# Patient Record
Sex: Male | Born: 1995 | Race: White | Hispanic: No | Marital: Single | Smoking: Never smoker
Health system: Southern US, Community
[De-identification: ages and names within clinical notes are randomized; demographics above are authoritative.]

## PROBLEM LIST (undated history)

## (undated) DIAGNOSIS — F32A Depression, unspecified: Secondary | ICD-10-CM

## (undated) DIAGNOSIS — T7840XA Allergy, unspecified, initial encounter: Secondary | ICD-10-CM

## (undated) DIAGNOSIS — K219 Gastro-esophageal reflux disease without esophagitis: Secondary | ICD-10-CM

## (undated) DIAGNOSIS — F411 Generalized anxiety disorder: Secondary | ICD-10-CM

## (undated) HISTORY — PX: NO PAST SURGERIES: SHX2092

## (undated) HISTORY — DX: Generalized anxiety disorder: F41.1

## (undated) HISTORY — DX: Depression, unspecified: F32.A

## (undated) HISTORY — DX: Allergy, unspecified, initial encounter: T78.40XA

## (undated) HISTORY — DX: Gastro-esophageal reflux disease without esophagitis: K21.9

---
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
## 2017-06-29 ENCOUNTER — Encounter: Payer: Managed Care, Other (non HMO) | Admitting: Family Medicine

## 2017-06-29 NOTE — Progress Notes (Deleted)
Office Note 06/29/2017  CC: No chief complaint on file.   HPI:  Jason PimpleMatthew E Tersigni is a 21 y.o. male who is here for annual health maintenance exam.   Past Medical History:  Diagnosis Date  . GAD (generalized anxiety disorder)    sees psychiatrist as of 05/2016    No past surgical history on file.  Family History  Problem Relation Age of Onset  . Alcohol abuse Mother   . Diabetes Paternal Grandmother   . Heart disease Paternal Grandfather     Social History   Social History  . Marital status: Single    Spouse name: N/A  . Number of children: N/A  . Years of education: N/A   Occupational History  . Not on file.   Social History Main Topics  . Smoking status: Never Smoker  . Smokeless tobacco: Never Used  . Alcohol use No  . Drug use: No  . Sexual activity: Not on file   Other Topics Concern  . Not on file   Social History Narrative   [employer] grad.     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   No T/A/Ds.    Outpatient Medications Prior to Visit  Medication Sig Dispense Refill  . sertraline (ZOLOFT) 50 MG tablet Take 1 tablet by mouth daily.     No facility-administered medications prior to visit.     No Known Allergies  ROS *** PE; There were no vitals taken for this visit. *** Pertinent labs:  No results found for: TSH No results found for: WBC, HGB, HCT, MCV, PLT No results found for: CREATININE, BUN, NA, K, CL, CO2 No results found for: ALT, AST, GGT, ALKPHOS, BILITOT No results found for: CHOL No results found for: HDL No results found for: LDLCALC No results found for: TRIG No results found for: CHOLHDL No results found for: PSA    ASSESSMENT AND PLAN:   No problem-specific Assessment & Plan notes found for this encounter.   FOLLOW UP:  No Follow-up on file.

## 2018-11-10 ENCOUNTER — Encounter: Payer: Self-pay | Admitting: Emergency Medicine

## 2018-11-10 DIAGNOSIS — F411 Generalized anxiety disorder: Secondary | ICD-10-CM | POA: Insufficient documentation

## 2018-11-10 DIAGNOSIS — F1210 Cannabis abuse, uncomplicated: Secondary | ICD-10-CM | POA: Insufficient documentation

## 2018-11-17 ENCOUNTER — Ambulatory Visit: Payer: Self-pay | Admitting: Psychiatry

## 2018-12-15 DIAGNOSIS — Z23 Encounter for immunization: Secondary | ICD-10-CM | POA: Diagnosis not present

## 2019-04-01 ENCOUNTER — Encounter: Payer: Self-pay | Admitting: Family Medicine

## 2019-04-01 ENCOUNTER — Other Ambulatory Visit: Payer: Self-pay

## 2019-04-01 ENCOUNTER — Ambulatory Visit (INDEPENDENT_AMBULATORY_CARE_PROVIDER_SITE_OTHER): Payer: 59 | Admitting: Family Medicine

## 2019-04-01 DIAGNOSIS — F320 Major depressive disorder, single episode, mild: Secondary | ICD-10-CM | POA: Diagnosis not present

## 2019-04-01 DIAGNOSIS — F411 Generalized anxiety disorder: Secondary | ICD-10-CM

## 2019-04-01 MED ORDER — SERTRALINE HCL 50 MG PO TABS: ORAL_TABLET | ORAL | 0 refills | Status: DC

## 2019-04-01 NOTE — Progress Notes (Signed)
Virtual Visit via Video Note  I connected with pt on 04/01/19 at 11:00 AM EDT by a video enabled telemedicine application and verified that I am speaking with the correct person using two identifiers.  Location patient: home Location provider:work or home office Persons participating in the virtual visit: patient, provider  I discussed the limitations of evaluation and management by telemedicine and the availability of in person appointments. The patient expressed understanding and agreed to proceed.   HPI: 23 y/o male being seen today for f/u GAD. He has seen a psychiatrist for this problem in the past, on sertraline 100mg  qd when I saw him last in 2017 for [facility]. He tried to ween himself off sertraline about 8 mo ago, got off of it completely approx 10/2018. He wanted to see how he could do off of it.  He has come to realize he needs to get back on the med so he is interested in getting restarted on sertraline. He finds that he has been feeling a bit of a low mood consistently, poor motivation, worried/stressing a lot over everything, hard on himself.  No uncontrolled impulsivity, no SI or HI. No panic attacks, just generalized anxiety.   Trying to exercise more. This has all impacted his school work, grades have suffered significantly. Has been more distant with family, friends. +Anhedonia.  Trying increased caffeine. Minimal use of alcohol to try to boost mood or decrease anxiety. No illegal drugs.   ROS: See pertinent positives and negatives per HPI.  Past Medical History:  Diagnosis Date  . GAD (generalized anxiety disorder)    sees psychiatrist as of 05/2016    No past surgical history on file.  Family History  Problem Relation Age of Onset  . Alcohol abuse Mother   . Diabetes Paternal Grandmother   . 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   Current Outpatient Medications:  .  sertraline  (ZOLOFT) 100 MG tablet, Take 100 mg by mouth daily., Disp: , Rfl:  NOT CURRENTLY TAKING THIS MED  EXAM:  VITALS per patient if applicable: There were no vitals taken for this visit.   GENERAL: alert, oriented, appears well and in no acute distress  HEENT: atraumatic, conjunttiva clear, no obvious abnormalities on inspection of external nose and ears  NECK: normal movements of the head and neck  LUNGS: on inspection no signs of respiratory distress, breathing rate appears normal, no obvious gross SOB, gasping or wheezing  CV: no obvious cyanosis  MS: moves all visible extremities without noticeable abnormality  PSYCH/NEURO: pleasant and cooperative, no obvious depression or anxiety, speech and thought processing grossly intact  ASSESSMENT AND PLAN:  Discussed the following assessment and plan:  1) GAD with MDD (mild). This was in remission/well controlled while on sertraline 100 mg qd (long term). Will restart sertraline 50mg ; 1 qd x 7d, then increase to 2 qd. Therapeutic expectations and side effect profile of medication discussed today.  Patient's questions answered.   Spent 25 min with pt today, with >50% of this time spent in counseling and care coordination regarding the above problems.  I discussed the assessment and treatment plan with the patient. The patient was provided an opportunity to ask questions and all were answered. The patient agreed with the plan and demonstrated an understanding of the instructions.   The patient was advised to call back or seek an in-person evaluation if the symptoms worsen or if  the condition fails to improve as anticipated.  F/u: 1 mo  Signed:  Santiago BumpersPhil Hilma Steinhilber, MD           04/01/2019

## 2019-04-24 ENCOUNTER — Other Ambulatory Visit: Payer: Self-pay | Admitting: Family Medicine

## 2019-04-26 ENCOUNTER — Ambulatory Visit (INDEPENDENT_AMBULATORY_CARE_PROVIDER_SITE_OTHER): Payer: 59 | Admitting: Family Medicine

## 2019-04-26 ENCOUNTER — Encounter: Payer: Self-pay | Admitting: Family Medicine

## 2019-04-26 ENCOUNTER — Other Ambulatory Visit: Payer: Self-pay

## 2019-04-26 VITALS — Temp 97.7°F

## 2019-04-26 DIAGNOSIS — F3340 Major depressive disorder, recurrent, in remission, unspecified: Secondary | ICD-10-CM | POA: Diagnosis not present

## 2019-04-26 DIAGNOSIS — F411 Generalized anxiety disorder: Secondary | ICD-10-CM | POA: Diagnosis not present

## 2019-04-26 DIAGNOSIS — K219 Gastro-esophageal reflux disease without esophagitis: Secondary | ICD-10-CM | POA: Diagnosis not present

## 2019-04-26 MED ORDER — PANTOPRAZOLE SODIUM 40 MG PO TBEC: 40.0000 mg | DELAYED_RELEASE_TABLET | Freq: Every day | ORAL | 3 refills | Status: DC

## 2019-04-26 MED ORDER — SERTRALINE HCL 100 MG PO TABS: 100.0000 mg | ORAL_TABLET | Freq: Every day | ORAL | 3 refills | Status: DC

## 2019-04-26 NOTE — Progress Notes (Signed)
Virtual Visit via Video Note  I connected with pt on 04/26/19 at 10:40 AM EDT by a video enabled telemedicine application and verified that I am speaking with the correct person using two identifiers.  Location patient: home Location provider:work or home office Persons participating in the virtual visit: patient, provider  I discussed the limitations of evaluation and management by telemedicine and the availability of in person appointments. The patient expressed understanding and agreed to proceed.  Telemedicine visit is a necessity given the COVID-19 restrictions in place at the current time.  HPI: Pt is 23 y/o male being seen today for 1 mo f/u GAD with mild episode of MDD. I started him back on sertraline last visit (after he had self d/c'd this med about 8 mo prior). Instructions were given to titrate to 100 mg qd dosing.  This was his therapeutic dose when he was on sertraline before.  Interim hx: Doing MUCH better.  Mood back to normal as is his anxiety level. No adverse effects from sertraline.  New prob: his mom assists with history-->"years" of significant "atypical GER/LPR sx's such as postprandial dry cough, globus sensation, repetitive throat clearing, some morning nausea.  No dysphagia, regurgitation, wheezing, heartburn, gas, or abd pains.  He has used prevacid otc somewhat successfully in the past but admits he hasn't modified his diet much despite knowing what changes to make.  Takes tums regularly and it does help.  Recent 2 week course of otc prevacid was not helpful.  No hx of seeing a specialist in the past for this. He does have some mild PND but takes zyrtec to minimize this.    ROS: See pertinent positives and negatives per HPI.  Past Medical History:  Diagnosis Date  . GAD (generalized anxiety disorder)    sees psychiatrist as of 05/2016    No past surgical history on file.  Family History  Problem Relation Age of Onset  . Alcohol abuse Mother   . Diabetes  Paternal Grandmother   . 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   Current Outpatient Medications:  .  sertraline (ZOLOFT) 50 MG tablet, 1 tab po qd x 7d, then increase to 2 tabs po qd, Disp: 53 tablet, Rfl: 0  EXAM:  VITALS per patient if applicable: Temp 97.7 F (36.5 C) (Oral)    GENERAL: alert, oriented, appears well and in no acute distress  HEENT: atraumatic, conjunttiva clear, no obvious abnormalities on inspection of external nose and ears  NECK: normal movements of the head and neck  LUNGS: on inspection no signs of respiratory distress, breathing rate appears normal, no obvious gross SOB, gasping or wheezing  CV: no obvious cyanosis  MS: moves all visible extremities without noticeable abnormality  PSYCH/NEURO: pleasant and cooperative, no obvious depression or anxiety, speech and thought processing grossly intact  LABS: none today  ASSESSMENT AND PLAN:  Discussed the following assessment and plan:  1) GAD w/mild MDD episode: GREAT response to sertraline 100mg  qd, similar to the past. The current medical regimen is effective;  continue present plan and medications.  2) LPR.  Will do pantoprazole 40mg  qd, also add otc pepcid 20mg  qhs for the next 2 weeks. GERD- friendly diet encouraged, elevate head of bed w/2X 4. Sounds like he will need long term PPI treatment.  I discussed the assessment and treatment plan with the patient. The patient was provided an opportunity to  ask questions and all were answered. The patient agreed with the plan and demonstrated an understanding of the instructions.   The patient was advised to call back or seek an in-person evaluation if the symptoms worsen or if the condition fails to improve as anticipated.  F/u: 2 wks.  Signed:  Santiago BumpersPhil Savyon Loken, MD           04/26/2019

## 2019-04-29 ENCOUNTER — Ambulatory Visit: Payer: 59 | Admitting: Family Medicine

## 2019-11-21 ENCOUNTER — Encounter: Payer: Self-pay | Admitting: Family Medicine

## 2019-11-21 ENCOUNTER — Ambulatory Visit (INDEPENDENT_AMBULATORY_CARE_PROVIDER_SITE_OTHER): Payer: 59 | Admitting: Family Medicine

## 2019-11-21 ENCOUNTER — Other Ambulatory Visit: Payer: Self-pay

## 2019-11-21 VITALS — Wt 155.0 lb

## 2019-11-21 DIAGNOSIS — J019 Acute sinusitis, unspecified: Secondary | ICD-10-CM | POA: Diagnosis not present

## 2019-11-21 MED ORDER — AMOXICILLIN 875 MG PO TABS: 875.0000 mg | ORAL_TABLET | Freq: Two times a day (BID) | ORAL | 0 refills | Status: AC

## 2019-11-21 NOTE — Progress Notes (Signed)
Virtual Visit via Video Note  I connected with Jason Buckley on 11/21/19 at  4:00 PM EST by a video enabled telemedicine application and verified that I am speaking with the correct person using two identifiers.  Location patient: home Location provider:work or home office Persons participating in the virtual visit: patient, provider  I discussed the limitations of evaluation and management by telemedicine and the availability of in person appointments. The patient expressed understanding and agreed to proceed.  Telemedicine visit is a necessity given the COVID-19 restrictions in place at the current time.  HPI: 23 y/o male being seen today for "sinus" symptoms. "My sinuses are killing me", onset about 6 wks ago but bad/worse in the last week or so. Lots of nasal congestion, some thick mucous comes out sometimes when he blows nose. Has thick PND.  No ST.  Appetite down. No cough or wheezing.  Feels tenderness in ethmoid sinus regions and "puffy" in forehead. Has used tylenol some, some OTC nasal decongestant some but not too much.  ROS: See pertinent positives and negatives per HPI.  Past Medical History:  Diagnosis Date  . GAD (generalized anxiety disorder)    sees psychiatrist as of 05/2016    No past surgical history on file.  Family History  Problem Relation Age of Onset  . Alcohol abuse Mother   . Diabetes Paternal Grandmother   . Heart disease Paternal Grandfather     Social History   Socioeconomic History  . Marital status: Single    Spouse name: Not on file  . Number of children: Not on file  . Years of education: Not on file  . Highest education level: Not on file  Occupational History  . Not on file  Tobacco Use  . Smoking status: Never Smoker  . Smokeless tobacco: Never Used  Substance and Sexual Activity  . Alcohol use: No  . Drug use: No  . Sexual activity: Not on file  Other Topics Concern  . Not on file  Social History Narrative   [employer] grad.     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   No T/A/Ds.   Social Determinants of Health   Financial Resource Strain:   . Difficulty of Paying Living Expenses: Not on file  Food Insecurity:   . Worried About Charity fundraiser in the Last Year: Not on file  . Ran Out of Food in the Last Year: Not on file  Transportation Needs:   . Lack of Transportation (Medical): Not on file  . Lack of Transportation (Non-Medical): Not on file  Physical Activity:   . Days of Exercise per Week: Not on file  . Minutes of Exercise per Session: Not on file  Stress:   . Feeling of Stress : Not on file  Social Connections:   . Frequency of Communication with Friends and Family: Not on file  . Frequency of Social Gatherings with Friends and Family: Not on file  . Attends Religious Services: Not on file  . Active Member of Clubs or Organizations: Not on file  . Attends Archivist Meetings: Not on file  . Marital Status: Not on file      Current Outpatient Medications:  .  pantoprazole (PROTONIX) 40 MG tablet, Take 1 tablet (40 mg total) by mouth daily., Disp: 90 tablet, Rfl: 3 .  sertraline (ZOLOFT) 100 MG tablet, Take 1 tablet (100 mg total) by mouth  daily., Disp: 90 tablet, Rfl: 3  EXAM:  VITALS per patient if applicable: There were no vitals taken for this visit.   GENERAL: alert, oriented, appears well and in no acute distress  HEENT: atraumatic, conjunttiva clear, no obvious abnormalities on inspection of external nose and ears  NECK: normal movements of the head and neck  LUNGS: on inspection no signs of respiratory distress, breathing rate appears normal, no obvious gross SOB, gasping or wheezing  CV: no obvious cyanosis  MS: moves all visible extremities without noticeable abnormality  PSYCH/NEURO: pleasant and cooperative, no obvious depression or anxiety, speech and thought processing grossly intact  LABS: none  today  ASSESSMENT AND PLAN:  Discussed the following assessment and plan:  URI, prolonged. Will treat as bacterial sinusitis. Amoxil 875mg  bid x 10d. Encouraged saline nasal spray or irrigation. No antihistamine or nasal decongestant spray recommended at this time. Signs/symptoms to call or return for were reviewed and Jason Buckley expressed understanding.   I discussed the assessment and treatment plan with the patient. The patient was provided an opportunity to ask questions and all were answered. The patient agreed with the plan and demonstrated an understanding of the instructions.   The patient was advised to call back or seek an in-person evaluation if the symptoms worsen or if the condition fails to improve as anticipated.  F/u: prn  Signed:  , MD           11/21/2019

## 2020-03-05 ENCOUNTER — Encounter: Payer: Self-pay | Admitting: Gastroenterology

## 2020-04-06 ENCOUNTER — Ambulatory Visit: Payer: 59 | Admitting: Gastroenterology

## 2020-04-24 ENCOUNTER — Encounter: Payer: Self-pay | Admitting: Gastroenterology

## 2020-04-24 ENCOUNTER — Ambulatory Visit: Payer: 59 | Admitting: Gastroenterology

## 2020-04-24 DIAGNOSIS — R110 Nausea: Secondary | ICD-10-CM | POA: Diagnosis not present

## 2020-04-24 DIAGNOSIS — K219 Gastro-esophageal reflux disease without esophagitis: Secondary | ICD-10-CM | POA: Diagnosis not present

## 2020-04-24 MED ORDER — FAMOTIDINE 20 MG PO TABS: 20.0000 mg | ORAL_TABLET | Freq: Every day | ORAL | 3 refills | Status: DC

## 2020-04-24 MED ORDER — PANTOPRAZOLE SODIUM 40 MG PO TBEC: DELAYED_RELEASE_TABLET | ORAL | 3 refills | Status: DC

## 2020-04-24 NOTE — Progress Notes (Signed)
HPI: This is a very pleasant 24 year old man who was referred to me by Tammi Sou, MD  to evaluate GERD, nausea.    He is here with his mother today.  He has had issues with nausea since he was 37 or 24 years old.  He underwent what sounds like ENT evaluation remotely.  He underwent allergy testing.  His mother eventually determined that it was probably GERD related and has had them on a variety of antiacid medicines over the years.  Lately he has been taking proton pump inhibitor Protonix 40 mg shortly before his dinner meal every night.  He says this helps but certainly his nausea is not gone.  He is not losing weight.  He does not have dysphagia.  He does have a cough especially when his nausea symptoms are at their worst.  He does not take NSAIDs routinely.  He rarely eats breakfast and he usually has a cup of coffee first thing in the morning   Review of systems: Pertinent positive and negative review of systems were noted in the above HPI section. All other review negative.   Past Medical History:  Diagnosis Date  . GAD (generalized anxiety disorder)    sees psychiatrist as of 05/2016    Past Surgical History:  Procedure Laterality Date  . NO PAST SURGERIES      Current Outpatient Medications  Medication Sig Dispense Refill  . pantoprazole (PROTONIX) 40 MG tablet Take 1 tablet (40 mg total) by mouth daily. 90 tablet 3  . sertraline (ZOLOFT) 100 MG tablet Take 1 tablet (100 mg total) by mouth daily. 90 tablet 3   No current facility-administered medications for this visit.    Allergies as of 04/24/2020  . (No Known Allergies)    Family History  Problem Relation Age of Onset  . Alcohol abuse Mother   . Diabetes Paternal Grandmother   . Heart disease Paternal Grandfather   . Irritable bowel syndrome Maternal Grandfather     Social History   Socioeconomic History  . Marital status: Single    Spouse name: Not on file  . Number of children: 0  . Years of  education: Not on file  . Highest education level: Not on file  Occupational History  . 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 Smoking status: Never Smoker  . Smokeless tobacco: Never Used  Substance and Sexual Activity  . Alcohol use: Yes    Comment: occasional  . Drug use: No  . Sexual activity: Not on file  Other Topics Concern  . Not on file  Social History Narrative   [employer] grad.     Freshman at [employer] for [employer], fall 2017.   A/B student.   Cross country runner.   No T/A/Ds.   Social Determinants of Health   Financial Resource Strain:   . Difficulty of Paying Living Expenses:   Food Insecurity:   . Worried About Charity fundraiser in the Last Year:   . Arboriculturist in the Last Year:   Transportation Needs:   . Film/video editor (Medical):   Marland Kitchen Lack of Transportation (Non-Medical):   Physical Activity:   . Days of Exercise per Week:   . Minutes of Exercise per Session:   Stress:   . Feeling of Stress :   Social Connections:   . Frequency of Communication with Friends and Family:   . Frequency of Social Gatherings with Friends and Family:   .  Attends Religious Services:   . Active Member of Clubs or Organizations:   . Attends Banker Meetings:   Marland Kitchen Marital Status:   Intimate Partner Violence:   . Fear of Current or Ex-Partner:   . Emotionally Abused:   Marland Kitchen Physically Abused:   . Sexually Abused:      Physical Exam: Ht 5\' 9"  (1.753 m)   Wt 157 lb (71.2 kg)   BMI 23.18 kg/m  Constitutional: generally well-appearing Psychiatric: alert and oriented x3 Eyes: extraocular movements intact Mouth: oral pharynx moist, no lesions Neck: supple no lymphadenopathy Cardiovascular: heart regular rate and rhythm Lungs: clear to auscultation bilaterally Abdomen: soft, nontender, nondistended, no obvious ascites, no peritoneal signs, normal bowel sounds Extremities: no lower extremity edema bilaterally Skin: no lesions on visible  extremities   Assessment and plan: 24 y.o. male with chronic nausea  I think there is certainly a good chance that his nausea is related to GERD.  I recommended that he change the way he is taking his proton pump inhibitor so that he takes it 20 to 30 minutes prior to his first meal of the day which is usually around noon time.  I recommended that he start taking a Pepcid 20 mg 1 pill at bedtime every night.  Lastly I recommended further evaluation of his nausea with EGD at his soonest convenience.  I will plan to be looking for H. pylori, peptic ulcer disease, chronic acid damage, hiatal hernia.    Please see the "Patient Instructions" section for addition details about the plan.   38, MD [facility] 04/24/2020, 10:58 AM  Cc: McGowen, 04/26/2020, MD  Total time on date of encounter was 45  minutes (this included time spent preparing to see the patient reviewing records; obtaining and/or reviewing separately obtained history; performing a medically appropriate exam and/or evaluation; counseling and educating the patient and family if present; ordering medications, tests or procedures if applicable; and documenting clinical information in the health record).

## 2020-04-24 NOTE — Patient Instructions (Addendum)
If you are age 22 or older, your body mass index should be between 23-30. Your Body mass index is 23.18 kg/m. If this is out of the aforementioned range listed, please consider follow up with your Primary Care Provider.  If you are age 64 or younger, your body mass index should be between 19-25. Your Body mass index is 23.18 kg/m. If this is out of the aformentioned range listed, please consider follow up with your Primary Care Provider.   You have been scheduled for an endoscopy. Please follow written instructions given to you at your visit today. If you use inhalers (even only as needed), please bring them with you on the day of your procedure.  Due to recent changes in healthcare laws, you may see the results of your imaging and laboratory studies on MyChart before your provider has had a chance to review them.  We understand that in some cases there may be results that are confusing or concerning to you. Not all laboratory results come back in the same time frame and the provider may be waiting for multiple results in order to interpret others.  Please give [place] 48 hours in order for your provider to thoroughly review all the results before contacting the office for clarification of your results.   CHANGE: Protonix 40mg  one tablet daily 30 minutes prior to lunch meal.  Please purchase the following medications over the counter and take as directed:  START: Pepcid 20mg  take one tablet daily at bedtime.  Thank you for entrusting me with your care and choosing [facility].  Dr 

## 2020-04-26 ENCOUNTER — Telehealth: Payer: Self-pay

## 2020-04-26 NOTE — Telephone Encounter (Signed)
Made in error

## 2020-05-18 ENCOUNTER — Ambulatory Visit (AMBULATORY_SURGERY_CENTER): Payer: 59 | Admitting: Gastroenterology

## 2020-05-18 ENCOUNTER — Other Ambulatory Visit: Payer: Self-pay

## 2020-05-18 ENCOUNTER — Encounter: Payer: Self-pay | Admitting: Gastroenterology

## 2020-05-18 VITALS — BP 119/73 | HR 63 | Temp 97.3°F | Resp 17 | Ht 69.0 in | Wt 157.0 lb

## 2020-05-18 DIAGNOSIS — R110 Nausea: Secondary | ICD-10-CM

## 2020-05-18 DIAGNOSIS — K2970 Gastritis, unspecified, without bleeding: Secondary | ICD-10-CM | POA: Diagnosis not present

## 2020-05-18 DIAGNOSIS — K2980 Duodenitis without bleeding: Secondary | ICD-10-CM

## 2020-05-18 DIAGNOSIS — K319 Disease of stomach and duodenum, unspecified: Secondary | ICD-10-CM

## 2020-05-18 DIAGNOSIS — K2950 Unspecified chronic gastritis without bleeding: Secondary | ICD-10-CM

## 2020-05-18 DIAGNOSIS — K219 Gastro-esophageal reflux disease without esophagitis: Secondary | ICD-10-CM

## 2020-05-18 MED ORDER — SODIUM CHLORIDE 0.9 % IV SOLN: 500.0000 mL | Freq: Once | INTRAVENOUS | Status: AC

## 2020-05-18 NOTE — Patient Instructions (Signed)
HANDOUTS PROVIDED ON: GASTRITIS  The biopsies taken today have been sent for pathology.  The results can take 1-3 weeks to receive.     You may resume your previous diet and medication schedule.  Thank you for allowing us to care for you today!!!   YOU HAD AN ENDOSCOPIC PROCEDURE TODAY AT THE [facility]:   Refer to the procedure report that was given to you for any specific questions about what was found during the examination.  If the procedure report does not answer your questions, please call your gastroenterologist to clarify.  If you requested that your care partner not be given the details of your procedure findings, then the procedure report has been included in a sealed envelope for you to review at your convenience later.  YOU SHOULD EXPECT: Some feelings of bloating in the abdomen. Passage of more gas than usual.  Walking can help get rid of the air that was put into your GI tract during the procedure and reduce the bloating.   Please Note:  You might notice some irritation and congestion in your nose or some drainage.  This is from the oxygen used during your procedure.  There is no need for concern and it should clear up in a day or so.  SYMPTOMS TO REPORT IMMEDIATELY:   Following upper endoscopy (EGD)  Vomiting of blood or coffee ground material  New chest pain or pain under the shoulder blades  Painful or persistently difficult swallowing  New shortness of breath  Fever of 100F or higher  Black, tarry-looking stools  For urgent or emergent issues, a gastroenterologist can be reached at any hour by calling [phone]. Do not use MyChart messaging for urgent concerns.    DIET:  We do recommend a small meal at first, but then you may proceed to your regular diet.  Drink plenty of fluids but you should avoid alcoholic beverages for 24 hours.  ACTIVITY:  You should plan to take it easy for the rest of today and you should NOT DRIVE or use heavy machinery  until tomorrow (because of the sedation medicines used during the test).    FOLLOW UP: Our staff will call the number listed on your records 48-72 hours following your procedure to check on you and address any questions or concerns that you may have regarding the information given to you following your procedure. If we do not reach you, we will leave a message.  We will attempt to reach you two times.  During this call, we will ask if you have developed any symptoms of COVID 19. If you develop any symptoms (ie: fever, flu-like symptoms, shortness of breath, cough etc.) before then, please call [phone].  If you test positive for Covid 19 in the 2 weeks post procedure, please call and report this information to us.    If any biopsies were taken you will be contacted by phone or by letter within the next 1-3 weeks.  Please call us at [phone] if you have not heard about the biopsies in 3 weeks.    SIGNATURES/CONFIDENTIALITY: You and/or your care partner have signed paperwork which will be entered into your electronic medical record.  These signatures attest to the fact that that the information above on your After Visit Summary has been reviewed and is understood.  Full responsibility of the confidentiality of this discharge information lies with you and/or your care-partner. 

## 2020-05-18 NOTE — Progress Notes (Signed)
VS-CW 

## 2020-05-18 NOTE — Op Note (Signed)
[facility] Patient Name: Jason Buckley Procedure Date: 05/18/2020 9:23 AM MRN: [id number] Endoscopist: Rachael Fee , MD Age: 24 Referring MD:  Date of Birth: 29-May-1996 Gender: Male Account #: [id number] Procedure:                Upper GI endoscopy Indications:              Chronic nausea, for many years Medicines:                Monitored Anesthesia Care Procedure:                Pre-Anesthesia Assessment:                           - Prior to the procedure, a History and Physical                            was performed, and patient medications and                            allergies were reviewed. The patient's tolerance of                            previous anesthesia was also reviewed. The risks                            and benefits of the procedure and the sedation                            options and risks were discussed with the patient.                            All questions were answered, and informed consent                            was obtained. Prior Anticoagulants: The patient has                            taken no previous anticoagulant or antiplatelet                            agents. ASA Grade Assessment: I - A normal, healthy                            patient. After reviewing the risks and benefits,                            the patient was deemed in satisfactory condition to                            undergo the procedure.                           After obtaining informed consent, the endoscope was  passed under direct vision. Throughout the                            procedure, the patient's blood pressure, pulse, and                            oxygen saturations were monitored continuously. The                            Endoscope was introduced through the mouth, and                            advanced to the second part of duodenum. The upper                            GI endoscopy was accomplished without  difficulty.                            The patient tolerated the procedure well. Scope In: Scope Out: Findings:                 Minimal inflammation characterized by erythema was                            found in the gastric antrum. Biopsies were taken                            with a cold forceps for histology.                           A few erosions without bleeding were found in the                            duodenal bulb. Biopsies were taken with a cold                            forceps for histology.                           The exam was otherwise without abnormality. Complications:            No immediate complications. Estimated blood loss:                            None. Estimated Blood Loss:     Estimated blood loss: none. Impression:               - Mild, non-specific gastritis. Biopsied.                           - Duodenal erosions without bleeding. Biopsied.                           - The examination was otherwise normal. Recommendation:           - Patient has a contact number available for  emergencies. The signs and symptoms of potential                            delayed complications were discussed with the                            patient. Return to normal activities tomorrow.                            Written discharge instructions were provided to the                            patient.                           - Resume previous diet.                           - Continue present medications.                           - Await pathology results. Milus Banister, MD 05/18/2020 9:37:41 AM This report has been signed electronically.

## 2020-05-18 NOTE — Progress Notes (Signed)
Report to PACU, RN, vss, BBS= Clear.  

## 2020-05-18 NOTE — Progress Notes (Signed)
Called to room to assist during endoscopic procedure.  Patient ID and intended procedure confirmed with present staff. Received instructions for my participation in the procedure from the performing physician.  

## 2020-05-22 ENCOUNTER — Telehealth: Payer: Self-pay

## 2020-05-22 NOTE — Telephone Encounter (Signed)
  Follow up Call-  Call back number 05/18/2020  Post procedure Call Back phone  # [phone]  Permission to leave phone message Yes  Some recent data might be hidden     Patient questions:  Do you have a fever, pain , or abdominal swelling? No. Pain Score  0 *  Have you tolerated food without any problems? Yes.    Have you been able to return to your normal activities? Yes.    Do you have any questions about your discharge instructions: Diet   No. Medications  No. Follow up visit  No.  Do you have questions or concerns about your Care? No.  Actions: * If pain score is 4 or above: No action needed, pain <4. 1. Have you developed a fever since your procedure? no  2.   Have you had an respiratory symptoms (SOB or cough) since your procedure? no  3.   Have you tested positive for COVID 19 since your procedure no  4.   Have you had any family members/close contacts diagnosed with the COVID 19 since your procedure?  no   If yes to any of these questions please route to Laverna Peace, RN and Charlett Lango, RN

## 2020-05-23 ENCOUNTER — Telehealth: Payer: Self-pay | Admitting: Gastroenterology

## 2020-05-23 DIAGNOSIS — R112 Nausea with vomiting, unspecified: Secondary | ICD-10-CM

## 2020-05-23 NOTE — Telephone Encounter (Signed)
Left message on machine to call back  

## 2020-05-23 NOTE — Telephone Encounter (Signed)
The patient has been notified of this information and all questions answered.    You have been scheduled for a gastric emptying scan at [facility] on 06/13/20 at 730 am. Please arrive at least 15 minutes prior to your appointment for registration. Please make certain not to have anything to eat or drink after midnight the night before your test. Hold all stomach medications (ex: Zofran, phenergan, Reglan) 48 hours prior to your test. If you need to reschedule your appointment, please contact radiology scheduling at [phone]. _________________________________________ A gastric-emptying study measures how long it takes for food to move through your stomach. There are several ways to measure stomach emptying. In the most common test, you eat food that contains a small amount of radioactive material. A scanner that detects the movement of the radioactive material is placed over your abdomen to monitor the rate at which food leaves your stomach. This test normally takes about 4 hours to complete. _________________________________________

## 2020-06-12 ENCOUNTER — Telehealth: Payer: Self-pay | Admitting: Gastroenterology

## 2020-06-12 NOTE — Telephone Encounter (Signed)
The pt's mother called and wanted to know if the HIDA scan is really necessary.  I advised her that is the test that Dr Christella Hartigan has recommended and it is his choice if he wants to proceed.  She states she will discuss with her husband and the patient and decide if they will proceed.  She was given the number to call if she wished to cancel.

## 2020-06-12 NOTE — Telephone Encounter (Signed)
Pt's mother is requesting a call back from a nurse in regards to the pt's procedure he has scheduled for tomorrow 7/14. Gastric Emptying.

## 2020-06-13 ENCOUNTER — Other Ambulatory Visit: Payer: Self-pay

## 2020-06-13 ENCOUNTER — Ambulatory Visit (HOSPITAL_COMMUNITY)
Admission: RE | Admit: 2020-06-13 | Discharge: 2020-06-13 | Disposition: A | Discharge status: Home / Self Care | Payer: 59 | Source: Ambulatory Visit | Attending: Gastroenterology | Admitting: Gastroenterology

## 2020-06-13 DIAGNOSIS — R112 Nausea with vomiting, unspecified: Secondary | ICD-10-CM

## 2020-06-13 IMAGING — NM NM GASTRIC EMPTYING
8 series · 8 of 8 positions shown · non-contrast
Comparison: None.

CLINICAL DATA: Nausea, vomiting

EXAM:
NUCLEAR MEDICINE GASTRIC EMPTYING SCAN
TECHNIQUE: After oral ingestion of radiolabeled meal, sequential abdominal
images were obtained for 4 hours. Percentage of activity emptying
the stomach was calculated at 1 hour, 2 hour, 3 hour.
RADIOPHARMACEUTICALS:  1.83 mCi [YB] sulfur colloid in
standardized meal

[Series 1: 0 min · 4.14mm/px · 1 of 1 slices shown (1 of 2)]
[im 1/1]
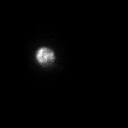

[Series 1: 0 min · 4.14mm/px · 1 of 1 slices shown (2 of 2)]
[im 1/1]
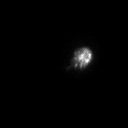

[Series 2: 1 hr · 4.14mm/px · 1 of 1 slices shown (1 of 2)]
[im 1/1]
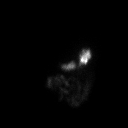

[Series 2: 1 hr · 4.14mm/px · 1 of 1 slices shown (2 of 2)]
[im 1/1]
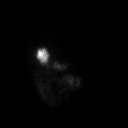

[Series 3: 2 hr · 4.14mm/px · 1 of 1 slices shown (1 of 2)]
[im 1/1]
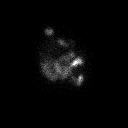

[Series 3: 2 hr · 4.14mm/px · 1 of 1 slices shown (2 of 2)]
[im 1/1]
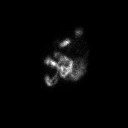

[Series 4: 3 hr · 4.14mm/px · 1 of 1 slices shown (1 of 2)]
[im 1/1]
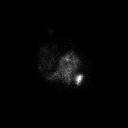

[Series 4: 3 hr · 4.14mm/px · 1 of 1 slices shown (2 of 2)]
[im 1/1]
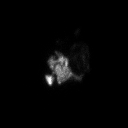

[8 of 8 positions shown; findings below may reference images not displayed]

FINDINGS: Expected location of the stomach in the left upper quadrant.
Ingested meal empties the stomach gradually over the course of the
study.

38% emptied at 1 hr ( normal >= 10%)

85% emptied at 2 hr ( normal >= 40%)

96% emptied at 3 hr ( normal >= 70%)
IMPRESSION: Normal gastric emptying study.

## 2020-06-13 MED ORDER — TECHNETIUM TC 99M SULFUR COLLOID
1.8300 | Freq: Once | INTRAVENOUS | Status: AC | PRN
  Administered 2020-06-13: 1.83 via INTRAVENOUS

## 2020-06-26 ENCOUNTER — Other Ambulatory Visit: Payer: Self-pay

## 2020-06-26 MED ORDER — SERTRALINE HCL 100 MG PO TABS: 100.0000 mg | ORAL_TABLET | Freq: Every day | ORAL | 0 refills | Status: DC

## 2020-08-24 ENCOUNTER — Ambulatory Visit: Payer: 59 | Admitting: Gastroenterology

## 2020-09-18 ENCOUNTER — Other Ambulatory Visit: Payer: Self-pay | Admitting: Family Medicine

## 2020-10-22 ENCOUNTER — Ambulatory Visit: Payer: 59 | Admitting: Gastroenterology

## 2020-11-14 ENCOUNTER — Other Ambulatory Visit: Payer: Self-pay

## 2020-11-15 ENCOUNTER — Ambulatory Visit: Payer: 59 | Admitting: Family Medicine

## 2020-11-15 ENCOUNTER — Encounter: Payer: Self-pay | Admitting: Family Medicine

## 2020-11-15 VITALS — BP 105/68 | HR 59 | Temp 97.5°F | Resp 16 | Ht 69.0 in | Wt 162.2 lb

## 2020-11-15 DIAGNOSIS — R112 Nausea with vomiting, unspecified: Secondary | ICD-10-CM | POA: Diagnosis not present

## 2020-11-15 DIAGNOSIS — K219 Gastro-esophageal reflux disease without esophagitis: Secondary | ICD-10-CM

## 2020-11-15 DIAGNOSIS — J329 Chronic sinusitis, unspecified: Secondary | ICD-10-CM | POA: Diagnosis not present

## 2020-11-15 MED ORDER — AMOXICILLIN 875 MG PO TABS
875.0000 mg | ORAL_TABLET | Freq: Two times a day (BID) | ORAL | 0 refills | Status: AC
Start: 2020-11-15 — End: 2020-11-29

## 2020-11-15 MED ORDER — PREDNISONE 20 MG PO TABS: ORAL_TABLET | ORAL | 0 refills | Status: DC

## 2020-11-15 NOTE — Progress Notes (Signed)
OFFICE VISIT  11/15/2020  CC:  Chief Complaint  Patient presents with   Follow-up    GERD   HPI:    Patient is a 24 y.o. male who presents for f/u GERD/LPRD. I dx'd him with this back in 04/2019 and rx'd PPI + short term H2 blocker. On 03/2020 he saw Dr. Christella Hartigan with GI for ongoing nausea in the setting of sub-optimally controlled GER. He was put back on H2 blocker to go with his PPI and was told to take his PPI 30 min prior to his first meal of the day (which is lunch for him). He was also set up for EGD to further eval. EGD 05/18/20 showed: Mild, non-specific gastritis. Biopsied. - Duodenal erosions without bleeding. Biopsied. - The examination was otherwise normal. Path: No sign of signif inflammation, H pylori NEG. Gastric emptying scan was recommended as next step and this was done 06/13/20 and was normal. He was set for f/u with Dr. Christella Hartigan but cancelled appts Sept and Nov 2021.  CURRENTLY: Still with nausea problems, worse in AM, vomits 2-3 times per week.  Thick mucous noted, has PND.  Some mild abd discomfort during this time but nothing prominent. Describes nasal congestion and eustachian tube dysfunction.  These are sx's he has had long term.  ? Causing his nausea problem.  Some feeling of facial pressure/fullness at times.   Remote hx of allergy testing, pt doesn't recall anything remarkable.  Currently taking xyzal and this helps his sx's a little, as much as any other antihistamine has. Occ use of saline nasal spray.  He doesn't recall ever being on nasal steroid.  No fevers, no cough, no cp, no wheezing, no sob.   Past Medical History:  Diagnosis Date   Allergy    Depression    GAD (generalized anxiety disorder)    sees psychiatrist as of 05/2016   GERD (gastroesophageal reflux disease)     Past Surgical History:  Procedure Laterality Date   NO PAST SURGERIES      Outpatient Medications Prior to Visit  Medication Sig Dispense Refill   famotidine  (PEPCID) 20 MG tablet Take 1 tablet (20 mg total) by mouth at bedtime. 30 tablet 3   pantoprazole (PROTONIX) 40 MG tablet Take 1 tablet daily 30 minutes prior to lunch meal. 90 tablet 3   sertraline (ZOLOFT) 100 MG tablet Take 1 tablet (100 mg total) by mouth daily. OFFICE VISIT NEEDED 14 tablet 0   Facility-Administered Medications Prior to Visit  Medication Dose Route Frequency Provider Last Rate Last Admin   0.9 %  sodium chloride infusion  500 mL Intravenous Once Rachael Fee, MD        No Known Allergies  ROS As per HPI  PE: Vitals with BMI 11/15/2020 05/18/2020 05/18/2020  Height 5\' 9"  - -  Weight 162 lbs 3 oz - -  BMI 23.94 - -  Systolic 105 119  Diastolic 68 73 72  Pulse 59 63 64  Some encounter information is confidential and restricted. Go to Review Flowsheets activity to see all data.   Gen: Alert, well appearing.  Patient is oriented to person, place, time, and situation. AFFECT: pleasant, lucid thought and speech. ENT: Ears: EACs clear, normal epithelium.  TMs with good light reflex and landmarks bilaterally.  Eyes: no injection, icteris, swelling, or exudate.  EOMI, PERRLA. Nose: no drainage or turbinate edema/swelling.  No injection or focal lesion.  Mouth: lips without lesion/swelling.  Oral mucosa pink and moist.  Dentition intact and without obvious caries or gingival swelling.  Oropharynx without erythema, exudate, or swelling.  No facial/sinus tenderness to palpation. Neck: no adenopathy.    LABS:  NONE in EMR  IMPRESSION AND PLAN:  1) Nausea, sometimes with vomiting, chronic. Not changed with maximum med therapy for GER/LPR. Long hx of nasal/sinus/eustachian tube dysfunction sx's, with PND that pt identifies as a possible trigger to his morning nausea. Question whether low-grade/indolent chronic sinusitis could be the cause of all his nausea.  Plan: amoxil 875mg  bid x 14d. Prednisone 40mg  qd x 5d, then 20mg  qd x 5d, then 10mg  qd x 4d. Plain  films of sinuses ordered. Considering ENT referral. No change in his other current meds.  An After Visit Summary was printed and given to the patient.  FOLLOW UP: Return for 2-3 wk f/u sinusitis + pnd nausea.  Signed:  , MD           11/15/2020

## 2020-11-30 ENCOUNTER — Other Ambulatory Visit: Payer: Self-pay | Admitting: Family Medicine

## 2020-12-03 ENCOUNTER — Telehealth: Payer: Self-pay | Admitting: Family Medicine

## 2020-12-03 MED ORDER — PREDNISONE 20 MG PO TABS
ORAL_TABLET | ORAL | 0 refills | Status: DC
Start: 2020-12-03 — End: 2021-01-28

## 2020-12-03 NOTE — Telephone Encounter (Signed)
Spoke with patient regarding symptoms. He reports N/V started again once he completed Prednisone.  Denies diarrhea or abdominal pain. States he is able to keep some food down.  Requesting refill for Prednisone, has f/u appt Wed 12/05/20.   Please advise.

## 2020-12-03 NOTE — Telephone Encounter (Signed)
Patient asking for refill of prednisone. States he is vomiting again since he finished the last fill. No other symptoms. Did not ask for appt.

## 2020-12-03 NOTE — Telephone Encounter (Signed)
OK, prednisone eRx'd. 

## 2020-12-03 NOTE — Telephone Encounter (Signed)
Patient advised refill sent. 

## 2020-12-05 ENCOUNTER — Encounter: Payer: Self-pay | Admitting: Family Medicine

## 2020-12-05 ENCOUNTER — Ambulatory Visit: Payer: 59 | Admitting: Family Medicine

## 2020-12-05 ENCOUNTER — Other Ambulatory Visit: Payer: Self-pay

## 2020-12-05 VITALS — BP 122/75 | HR 74 | Temp 97.8°F | Resp 16 | Ht 69.0 in | Wt 159.0 lb

## 2020-12-05 DIAGNOSIS — R0982 Postnasal drip: Secondary | ICD-10-CM | POA: Diagnosis not present

## 2020-12-05 DIAGNOSIS — J329 Chronic sinusitis, unspecified: Secondary | ICD-10-CM

## 2020-12-05 DIAGNOSIS — R112 Nausea with vomiting, unspecified: Secondary | ICD-10-CM

## 2020-12-05 DIAGNOSIS — R110 Nausea: Secondary | ICD-10-CM

## 2020-12-05 MED ORDER — LEVOFLOXACIN 500 MG PO TABS: 500.0000 mg | ORAL_TABLET | Freq: Every day | ORAL | 0 refills | Status: DC

## 2020-12-05 MED ORDER — FLUTICASONE PROPIONATE 50 MCG/ACT NA SUSP: 2.0000 | Freq: Every day | NASAL | 6 refills | Status: AC

## 2020-12-05 NOTE — Progress Notes (Signed)
OFFICE VISIT  12/05/2020  CC:  Chief Complaint  Patient presents with  . Follow-up    Sinusitis and nausea. Prednisone helps with appetite but still having issues with nausea/vomiting.   HPI:    Patient is a 25 y.o. Caucasian male who presents for 3 week f/u PND/nausea that we felt may be due to chronic/recurrent sinusitis. A/P as of last visit: "1) Nausea, sometimes with vomiting, chronic. Not changed with maximum med therapy for GER/LPR. Long hx of nasal/sinus/eustachian tube dysfunction sx's, with PND that pt identifies as a possible trigger to his morning nausea. Question whether low-grade/indolent chronic sinusitis could be the cause of all his nausea.  Plan: amoxil 875mg  bid x 14d. Prednisone 40mg  qd x 5d, then 20mg  qd x 5d, then 10mg  qd x 4d. Plain films of sinuses ordered. Considering ENT referral. No change in his other current meds."  INTERIM HX: He did not get sinus x-rays I ordered last visit. 11/22/20 outpt visit with [facility] system: Viral URI dx'd (flu and covid NEG). He called 12/03/20 to request more prednisone b/c apparently his PND/nausea came back after he finished the prednisone I had rx'd him 3 wks ago.   Now says nothing is better.  Still chronic postnasal drip that he assoc with waxing and waning nausea and vomiting---once most days, sometimes twice or more---mucous is what he throws up. +Facial pressure, some tenderness around L nostril, stuffy/muffled/popping ears bilat. No HAs.  No ST.  Cough some-->feels tickle from PND.  No wheezing, chest tightness, or sob. Appetite is ok ON PREDNISONE but otherwise struggles to eat b/c of nausea. He takes xyzal daily-not much help. Taking sudafed last few days--?minor improvement. Not on nasal steroid spray.  Of note, he has had these problems for many years, gradually worsening for last 2 yrs or so.   Past Medical History:  Diagnosis Date  . Allergy   . Depression   . GAD (generalized anxiety disorder)    sees  psychiatrist as of 05/2016  . GERD (gastroesophageal reflux disease)     Past Surgical History:  Procedure Laterality Date  . NO PAST SURGERIES      Outpatient Medications Prior to Visit  Medication Sig Dispense Refill  . famotidine (PEPCID) 20 MG tablet Take 1 tablet (20 mg total) by mouth at bedtime. 30 tablet 3  . levocetirizine (XYZAL) 5 MG tablet Take 5 mg by mouth every evening.    . pantoprazole (PROTONIX) 40 MG tablet Take 1 tablet daily 30 minutes prior to lunch meal. 90 tablet 3  . predniSONE (DELTASONE) 20 MG tablet 2 tabs po qd x 5d, then 1 tab po qd x 5d, then 1/2 tab po qd x 4d 17 tablet 0  . sertraline (ZOLOFT) 100 MG tablet Take 1 tablet (100 mg total) by mouth daily. OFFICE VISIT NEEDED 14 tablet 0   Facility-Administered Medications Prior to Visit  Medication Dose Route Frequency Provider Last Rate Last Admin  . 0.9 %  sodium chloride infusion  500 mL Intravenous Once , MD        No Known Allergies  ROS As per HPI  PE: Vitals with BMI 12/05/2020 11/15/2020 05/18/2020  Height 5\' 9"  5\' 9"  -  Weight 159 lbs 162 lbs 3 oz -  BMI 23.47 23.94 -  Systolic 122 105 Rachael Fee  Diastolic 75 68 73  Pulse 74 59 63  Some encounter information is confidential and restricted. Go to Review Flowsheets activity to see all data.   Gen:  Alert, well appearing.  Patient is oriented to person, place, time, and situation. AFFECT: pleasant, lucid thought and speech. ENT: Ears: EACs clear, normal epithelium.  TMs with good light reflex and landmarks bilaterally.  Eyes: no injection, icteris, swelling, or exudate.  EOMI, PERRLA. Nose: no drainage or turbinate edema/swelling.  No injection or focal lesion.   Scant amount of clear mucous on mucosal lining of both nares Mouth: lips without lesion/swelling.  Oral mucosa pink and moist.  Dentition intact and without obvious caries or gingival swelling.  Oropharynx without erythema, exudate, or swelling.  Neck - No masses or thyromegaly  or limitation in range of motion CV: RRR, no m/r/g.   LUNGS: CTA bilat, nonlabored resps, good aeration in all lung fields. EXT: no clubbing or cyanosis.  no edema.    LABS:  None  IMPRESSION AND PLAN:  Chronic postnasal drip, possibly from chronic sinusitis. This is resulting in significant GI symptoms->nausea and vomiting, poor appetite. This has been a chronic problem, getting worse.  GI eval reassuring but not revealing of any possible etiology of his n/v. Amoxil no help. Prednisone seems to have helped with appetite but PND mucous/sinus sx's seem the same. Plan is for him to get the sinus x-rays I ordered last visit, continue the most recent 14d course of prednisone I rx'd, and I'll do a 14d course of levaquin 500 mg qd.  I'll also add flonase daily. Needs ENT eval->he'll research [place], [place] area and let us know where to refer him. Continue daily antihistamine and sudafed.  An After Visit Summary was printed and given to the patient.  FOLLOW UP: Return for to be determined based on ENT eval, response to meds, etc.  Signed:  Santiago Bumpers, MD           12/05/2020

## 2020-12-25 ENCOUNTER — Other Ambulatory Visit: Payer: Self-pay | Admitting: Family Medicine

## 2021-01-21 ENCOUNTER — Other Ambulatory Visit: Payer: Self-pay | Admitting: Family Medicine

## 2021-01-22 ENCOUNTER — Other Ambulatory Visit: Payer: Self-pay

## 2021-01-22 ENCOUNTER — Encounter: Payer: Self-pay | Admitting: Family Medicine

## 2021-01-22 MED ORDER — SERTRALINE HCL 100 MG PO TABS: 100.0000 mg | ORAL_TABLET | Freq: Every day | ORAL | 0 refills | Status: DC

## 2021-01-28 ENCOUNTER — Telehealth (INDEPENDENT_AMBULATORY_CARE_PROVIDER_SITE_OTHER): Payer: 59 | Admitting: Family Medicine

## 2021-01-28 ENCOUNTER — Encounter: Payer: Self-pay | Admitting: Family Medicine

## 2021-01-28 DIAGNOSIS — R0982 Postnasal drip: Secondary | ICD-10-CM | POA: Diagnosis not present

## 2021-01-28 DIAGNOSIS — F330 Major depressive disorder, recurrent, mild: Secondary | ICD-10-CM | POA: Diagnosis not present

## 2021-01-28 DIAGNOSIS — F411 Generalized anxiety disorder: Secondary | ICD-10-CM

## 2021-01-28 MED ORDER — SERTRALINE HCL 100 MG PO TABS: 100.0000 mg | ORAL_TABLET | Freq: Every day | ORAL | 3 refills | Status: DC

## 2021-01-28 NOTE — Progress Notes (Signed)
Virtual Visit via Video Note  I connected with Jason Buckley on 01/28/21 at  4:00 PM EST by a video enabled telemedicine application and verified that I am speaking with the correct person using two identifiers.  Location patient: home, [place] Location provider:work or home office Persons participating in the virtual visit: patient, provider  I discussed the limitations of evaluation and management by telemedicine and the availability of in person appointments. The patient expressed understanding and agreed to proceed.   HPI: 25 y/o male being seen today for 7 week f/u chronic postnasal drip. A/P as of last visit: "Chronic postnasal drip, possibly from chronic sinusitis. This is resulting in significant GI symptoms->nausea and vomiting, poor appetite. This has been a chronic problem, getting worse.  GI eval reassuring but not revealing of any possible etiology of his n/v. Amoxil no help. Prednisone seems to have helped with appetite but PND mucous/sinus sx's seem the same. Plan is for him to get the sinus x-rays I ordered last visit, continue the most recent 14d course of prednisone I rx'd, and I'll do a 14d course of levaquin 500 mg qd.  I'll also add flonase daily. Needs ENT eval->he'll research [place], [place] area and let us know where to refer him. Continue daily antihistamine and sudafed."  INTERIM HX: DOING MUCH BETTER! Still never got the sinus x-rays I ordered 11/15/20. He took the levaquin and prednisone, started the flonase and says he only occasionally has any prob with PND or nausea/vomiting assoc with it. He is not taking any acid suppression medication.  He does take xyzal 5mg  qd. Mood and anxiety level have been stable and have responded well to sertraline 100mg  qd long term.  Sleep is good, appetite is good, energy level fine. He's working several shifts a week at and working full time as as well. Plans on taking a trip abroad in the next year or so.   ROS: See  pertinent positives and negatives per HPI.  Past Medical History:  Diagnosis Date  . Allergy   . Depression   . GAD (generalized anxiety disorder)    sees psychiatrist as of 05/2016  . GERD (gastroesophageal reflux disease)     Past Surgical History:  Procedure Laterality Date  . NO PAST SURGERIES       Current Outpatient Medications:  .  famotidine (PEPCID) 20 MG tablet, Take 1 tablet (20 mg total) by mouth at bedtime., Disp: 30 tablet, Rfl: 3 .  fluticasone (FLONASE) 50 MCG/ACT nasal spray, Place 2 sprays into both nostrils daily., Disp: 16 g, Rfl: 6 .  levocetirizine (XYZAL) 5 MG tablet, Take 5 mg by mouth every evening., Disp: , Rfl:  .  levofloxacin (LEVAQUIN) 500 MG tablet, Take 1 tablet (500 mg total) by mouth daily., Disp: 14 tablet, Rfl: 0 .  pantoprazole (PROTONIX) 40 MG tablet, Take 1 tablet daily 30 minutes prior to lunch meal., Disp: 90 tablet, Rfl: 3 .  predniSONE (DELTASONE) 20 MG tablet, 2 tabs po qd x 5d, then 1 tab po qd x 5d, then 1/2 tab po qd x 4d, Disp: 17 tablet, Rfl: 0 .  sertraline (ZOLOFT) 100 MG tablet, Take 1 tablet (100 mg total) by mouth daily. OFFICE VISIT NEEDED, Disp: 7 tablet, Rfl: 0  Current Facility-Administered Medications:  .  0.9 %  sodium chloride infusion, 500 mL, Intravenous, Once, Consulting civil engineer, MD  EXAM8/2017 per patient if applicable:  Vitals with BMI 12/05/2020 11/15/2020 05/18/2020  Height 5\' 9"  5'  9" -  Weight 159 lbs 162 lbs 3 oz -  BMI 23.47 23.94 -  Systolic 122 105 130  Diastolic 75 68 73  Pulse 74 59 63  Some encounter information is confidential and restricted. Go to Review Flowsheets activity to see all data.     GENERAL: alert, oriented, appears well and in no acute distress  HEENT: atraumatic, conjunttiva clear, no obvious abnormalities on inspection of external nose and ears  NECK: normal movements of the head and neck  LUNGS: on inspection no signs of respiratory distress, breathing rate appears normal, no  obvious gross SOB, gasping or wheezing  CV: no obvious cyanosis  MS: moves all visible extremities without noticeable abnormality  PSYCH/NEURO: pleasant and cooperative, no obvious depression or anxiety, speech and thought processing grossly intact  LABS: none today  ASSESSMENT AND PLAN:  Discussed the following assessment and plan:  1) Recurrent MDD/GAD: stable long term on sertraline 100mg  qd and we'll continue this, RFx done today.  2) Chronic PND with secondary nausea and vomiting: very well controlled of late since taking a 2 wk course of levaquin and prednisone + now on daily flonase. Cont flonase and xyzal.   I discussed the assessment and treatment plan with the patient. The patient was provided an opportunity to ask questions and all were answered. The patient agreed with the plan and demonstrated an understanding of the instructions.   F/u: 6 mo  Signed:  , MD           01/28/2021

## 2022-04-20 ENCOUNTER — Other Ambulatory Visit: Payer: Self-pay | Admitting: Family Medicine

## 2022-04-21 NOTE — Telephone Encounter (Signed)
Okay sertraline prescription sent. Follow-up 3 months.

## 2023-11-19 ENCOUNTER — Telehealth: Payer: Self-pay

## 2023-11-19 NOTE — Telephone Encounter (Signed)
Pt needs an appointment

## 2023-11-23 NOTE — Telephone Encounter (Signed)
Copied from CRM [id number]. Topic: Clinical - Prescription Issue >> Nov 23, 2023 10:36 AM Samuel Jester B wrote: Reason for CRM: PT stated that he is requesting a call back regarding medication sertraline, he stated that he has had it switched twice on where he needed it filled, and is needing it switched again.   Called pt but unable to reach. Pt is over due for office visit. [employer] message sent
